# Patient Record
Sex: Male | Born: 1990
Health system: Southern US, Community
[De-identification: ages and names within clinical notes are randomized; demographics above are authoritative.]

## PROBLEM LIST (undated history)

## (undated) DIAGNOSIS — S62309A Unspecified fracture of unspecified metacarpal bone, initial encounter for closed fracture: Secondary | ICD-10-CM

## (undated) DIAGNOSIS — F988 Other specified behavioral and emotional disorders with onset usually occurring in childhood and adolescence: Secondary | ICD-10-CM

## (undated) DIAGNOSIS — G43909 Migraine, unspecified, not intractable, without status migrainosus: Secondary | ICD-10-CM

## (undated) HISTORY — PX: NO PAST SURGERIES: SHX2092

---

## 2014-07-18 DIAGNOSIS — S62309A Unspecified fracture of unspecified metacarpal bone, initial encounter for closed fracture: Secondary | ICD-10-CM

## 2014-07-18 HISTORY — DX: Unspecified fracture of unspecified metacarpal bone, initial encounter for closed fracture: S62.309A

## 2014-07-31 ENCOUNTER — Encounter (HOSPITAL_BASED_OUTPATIENT_CLINIC_OR_DEPARTMENT_OTHER): Payer: Self-pay | Admitting: *Deleted

## 2014-07-31 ENCOUNTER — Emergency Department (HOSPITAL_BASED_OUTPATIENT_CLINIC_OR_DEPARTMENT_OTHER)
Admission: EM | Admit: 2014-07-31 | Discharge: 2014-07-31 | Disposition: A | Discharge status: Home / Self Care | Payer: 59 | Attending: Emergency Medicine | Admitting: Emergency Medicine

## 2014-07-31 ENCOUNTER — Emergency Department (HOSPITAL_BASED_OUTPATIENT_CLINIC_OR_DEPARTMENT_OTHER): Payer: 59

## 2014-07-31 DIAGNOSIS — Z8659 Personal history of other mental and behavioral disorders: Secondary | ICD-10-CM | POA: Diagnosis not present

## 2014-07-31 DIAGNOSIS — Y998 Other external cause status: Secondary | ICD-10-CM | POA: Diagnosis not present

## 2014-07-31 DIAGNOSIS — S62309A Unspecified fracture of unspecified metacarpal bone, initial encounter for closed fracture: Secondary | ICD-10-CM

## 2014-07-31 DIAGNOSIS — S62394A Other fracture of fourth metacarpal bone, right hand, initial encounter for closed fracture: Secondary | ICD-10-CM | POA: Insufficient documentation

## 2014-07-31 DIAGNOSIS — S6991XA Unspecified injury of right wrist, hand and finger(s), initial encounter: Secondary | ICD-10-CM | POA: Diagnosis present

## 2014-07-31 DIAGNOSIS — Y9367 Activity, basketball: Secondary | ICD-10-CM | POA: Diagnosis not present

## 2014-07-31 DIAGNOSIS — W51XXXA Accidental striking against or bumped into by another person, initial encounter: Secondary | ICD-10-CM | POA: Insufficient documentation

## 2014-07-31 DIAGNOSIS — Y92310 Basketball court as the place of occurrence of the external cause: Secondary | ICD-10-CM | POA: Diagnosis not present

## 2014-07-31 MED ORDER — HYDROCODONE-ACETAMINOPHEN 5-325 MG PO TABS: ORAL_TABLET | ORAL | Status: DC

## 2014-07-31 MED ORDER — NAPROXEN 500 MG PO TABS: 500.0000 mg | ORAL_TABLET | Freq: Two times a day (BID) | ORAL | Status: DC
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
## 2014-07-31 NOTE — ED Provider Notes (Signed)
CSN: [id number]     Arrival date & time 07/31/14  1804 History   First MD Initiated Contact with Patient 07/31/14 1824     Chief Complaint  Patient presents with  . Hand Injury     (Consider location/radiation/quality/duration/timing/severity/associated sxs/prior Treatment) HPI Comments: Patient who is right-handed, presents with complaint of right hand pain and swelling that began 4 days ago while playing basketball when the patient was struck on the right hand. Patient had initial pain and soreness in the area of his right fourth digit. He was unable to drive using that hand on the way home from playing basketball. For the past several days, the area has been sore but he has been able to do activities such as lifting weights with the hand. Today he was helping to move a piece of furniture when he felt and heard a crack in his hand at the site of the previous injury. Patient then had severe pain and swelling of his hand. No numbness or tingling. No treatments prior to arrival. No wrist pain, elbow pain, or shoulder pain. Onset was acute. Course is worsening. Nothing makes symptoms better. Movement and palpation makes the pain worse.  The history is provided by the patient.    Past Medical History  Diagnosis Date  . Anxiety    History reviewed. No pertinent past surgical history. No family history on file. History  Substance Use Topics  . Smoking status: Never Smoker   . Smokeless tobacco: Not on file  . Alcohol Use: No    Review of Systems  Constitutional: Negative for activity change.  Musculoskeletal: Positive for joint swelling and arthralgias. Negative for back pain, gait problem and neck pain.  Skin: Negative for wound.  Neurological: Negative for weakness and numbness.    Allergies  Review of patient's allergies indicates no known allergies.  Home Medications   Prior to Admission medications   Not on File   BP 118/64 mmHg  Pulse 72  Temp(Src) 98.3 F (36.8 C)  (Oral)  Resp 20  Ht  (1.905 m)  Wt 225 lb (102.059 kg)  BMI 28.12 kg/m2  SpO2 98%   Physical Exam  Constitutional: He appears well-developed and well-nourished.  HENT:  Head: Normocephalic and atraumatic.  Eyes: Conjunctivae are normal.  Neck: Normal range of motion. Neck supple.  Cardiovascular: Normal pulses.   Pulses:      Dorsalis pedis pulses are 2+ on the right side, and 2+ on the left side.       Posterior tibial pulses are 2+ on the right side, and 2+ on the left side.  Musculoskeletal: He exhibits tenderness. He exhibits no edema.       Right shoulder: Normal.       Right elbow: Normal.      Right wrist: Normal. He exhibits normal range of motion, no tenderness and no bony tenderness.       Right hand: He exhibits decreased range of motion and tenderness. He exhibits normal capillary refill, no deformity and no laceration. Normal sensation noted. He exhibits no finger abduction and no wrist extension trouble.       Hands: Neurological: He is alert. No sensory deficit.  Motor, sensation, and vascular distal to the injury is fully intact.   Skin: Skin is warm and dry.  Psychiatric: He has a normal mood and affect.  Nursing note and vitals reviewed.   ED Course  Procedures (including critical care time) Labs Review Labs Reviewed - No data to  display  Imaging Review Dg Hand Complete Right  07/31/2014   CLINICAL DATA:  Injury while playing basketball 6 days prior  EXAM: RIGHT HAND - COMPLETE 3+ VIEW  COMPARISON:  None.  FINDINGS: Frontal, oblique, and lateral views were obtained. There is a spiral fracture through the midportion of the fourth metacarpal with overall alignment near anatomic. No other fractures. No dislocation. Joint spaces appear intact.  IMPRESSION: Spiral fracture fourth metacarpal.   Electronically Signed   By: Bretta BangWilliam  Woodruff III M.D.   On: 07/31/2014 19:17     EKG Interpretation None       8:34 PM Patient seen and examined. Pt informed of  results. Will splint and give hand follow-up.    Vital signs reviewed and are as follows: BP 118/64 mmHg  Pulse 72  Temp(Src) 98.3 F (36.8 C) (Oral)  Resp 20  Ht 6\' 3"  (1.905 m)  Wt 225 lb (102.059 kg)  BMI 28.12 kg/m2  SpO2 98%  Encouraged follow-up and discuss importance of follow-up to ensure appropriate healing. Pain medication given as needed for home. Patient does not want any pain medicine here.  MDM   Final diagnoses:  Metacarpal bone fracture, closed, initial encounter   Patient with metacarpal bone fracture. Hand is neurovascularly intact. Splinted and ortho f/u indicated.     Renne CriglerJoshua Janett Kamath, PA-C 07/31/14 2049  Vanetta MuldersScott Zackowski, MD 07/31/14 [phone]

## 2014-07-31 NOTE — Discharge Instructions (Signed)
Please read and follow all provided instructions.  Your diagnoses today include:  1. Metacarpal bone fracture, closed, initial encounter     Tests performed today include:  An x-ray of the affected area - shows spiral fracture of the 4th metacarpal bone  Vital signs. See below for your results today.   Medications prescribed:   Vicodin (hydrocodone/acetaminophen) - narcotic pain medication  DO NOT drive or perform any activities that require you to be awake and alert because this medicine can make you drowsy. BE VERY CAREFUL not to take multiple medicines containing Tylenol (also called acetaminophen). Doing so can lead to an overdose which can damage your liver and cause liver failure and possibly death.   Naproxen - anti-inflammatory pain medication  Do not exceed  naproxen every 12 hours, take with food  You have been prescribed an anti-inflammatory medication or NSAID. Take with food. Take smallest effective dose for the shortest duration needed for your pain. Stop taking if you experience stomach pain or vomiting.   Take any prescribed medications only as directed.  Home care instructions:   Follow any educational materials contained in this packet  Follow R.I.C.E. Protocol:  R - rest your injury   I  - use ice on injury without applying directly to skin  C - compress injury with bandage or splint  E - elevate the injury as much as possible  Follow-up instructions: Please follow-up with the provided orthopedic physician (bone specialist).   Return instructions:   Please return if your fingers are numb or tingling, appear gray or blue, or you have severe pain (also elevate the arm and loosen splint or wrap if you were given one)  Please return to the Emergency Department if you experience worsening symptoms.   Please return if you have any other emergent concerns.  Additional Information:  Your vital signs today were: BP 118/64 mmHg   Pulse 72    Temp(Src) 98.3 F (36.8 C) (Oral)   Resp 20   Ht  (1.905 m)   Wt 225 lb (102.059 kg)   BMI 28.12 kg/m2   SpO2 98% If your blood pressure (BP) was elevated above 135/85 this visit, please have this repeated by your doctor within one month. --------------

## 2014-08-05 ENCOUNTER — Other Ambulatory Visit: Payer: Self-pay | Admitting: Orthopedic Surgery

## 2014-08-05 ENCOUNTER — Encounter (HOSPITAL_BASED_OUTPATIENT_CLINIC_OR_DEPARTMENT_OTHER): Payer: Self-pay | Admitting: *Deleted

## 2014-08-12 ENCOUNTER — Ambulatory Visit (HOSPITAL_BASED_OUTPATIENT_CLINIC_OR_DEPARTMENT_OTHER)
Admission: RE | Admit: 2014-08-12 | Discharge: 2014-08-12 | Disposition: A | Discharge status: Home / Self Care | Payer: 59 | Source: Ambulatory Visit | Attending: Orthopedic Surgery | Admitting: Orthopedic Surgery

## 2014-08-12 ENCOUNTER — Ambulatory Visit (HOSPITAL_BASED_OUTPATIENT_CLINIC_OR_DEPARTMENT_OTHER): Payer: 59 | Admitting: Anesthesiology

## 2014-08-12 ENCOUNTER — Encounter (HOSPITAL_BASED_OUTPATIENT_CLINIC_OR_DEPARTMENT_OTHER): Payer: Self-pay | Admitting: *Deleted

## 2014-08-12 ENCOUNTER — Encounter (HOSPITAL_BASED_OUTPATIENT_CLINIC_OR_DEPARTMENT_OTHER)
Admission: RE | Disposition: A | Discharge status: Home / Self Care | Payer: Self-pay | Source: Ambulatory Visit | Attending: Orthopedic Surgery

## 2014-08-12 DIAGNOSIS — Y929 Unspecified place or not applicable: Secondary | ICD-10-CM | POA: Insufficient documentation

## 2014-08-12 DIAGNOSIS — Y999 Unspecified external cause status: Secondary | ICD-10-CM | POA: Insufficient documentation

## 2014-08-12 DIAGNOSIS — X58XXXA Exposure to other specified factors, initial encounter: Secondary | ICD-10-CM | POA: Insufficient documentation

## 2014-08-12 DIAGNOSIS — F988 Other specified behavioral and emotional disorders with onset usually occurring in childhood and adolescence: Secondary | ICD-10-CM | POA: Insufficient documentation

## 2014-08-12 DIAGNOSIS — M25741 Osteophyte, right hand: Secondary | ICD-10-CM | POA: Diagnosis not present

## 2014-08-12 DIAGNOSIS — S62304A Unspecified fracture of fourth metacarpal bone, right hand, initial encounter for closed fracture: Secondary | ICD-10-CM | POA: Insufficient documentation

## 2014-08-12 DIAGNOSIS — Y9367 Activity, basketball: Secondary | ICD-10-CM | POA: Diagnosis not present

## 2014-08-12 HISTORY — DX: Unspecified fracture of unspecified metacarpal bone, initial encounter for closed fracture: S62.309A

## 2014-08-12 HISTORY — PX: OPEN REDUCTION INTERNAL FIXATION (ORIF) METACARPAL: SHX6234

## 2014-08-12 HISTORY — DX: Migraine, unspecified, not intractable, without status migrainosus: G43.909

## 2014-08-12 HISTORY — DX: Other specified behavioral and emotional disorders with onset usually occurring in childhood and adolescence: F98.8

## 2014-08-12 SURGERY — OPEN REDUCTION INTERNAL FIXATION (ORIF) METACARPAL
Anesthesia: General | Site: Finger | Laterality: Right

## 2014-08-12 MED ORDER — LIDOCAINE HCL (CARDIAC) 20 MG/ML IV SOLN
INTRAVENOUS | Status: DC | PRN
  Administered 2014-08-12: 40 mg via INTRAVENOUS

## 2014-08-12 MED ORDER — BUPIVACAINE HCL (PF) 0.25 % IJ SOLN
INTRAMUSCULAR | Status: DC | PRN
  Administered 2014-08-12: 7 mL

## 2014-08-12 MED ORDER — 0.9 % SODIUM CHLORIDE (POUR BTL) OPTIME
TOPICAL | Status: DC | PRN
  Administered 2014-08-12: 200 mL

## 2014-08-12 MED ORDER — DEXAMETHASONE SODIUM PHOSPHATE 4 MG/ML IJ SOLN
INTRAMUSCULAR | Status: DC | PRN
  Administered 2014-08-12: 10 mg via INTRAVENOUS

## 2014-08-12 MED ORDER — SCOPOLAMINE 1 MG/3DAYS TD PT72: 1.0000 | MEDICATED_PATCH | Freq: Once | TRANSDERMAL | Status: DC | PRN

## 2014-08-12 MED ORDER — FENTANYL CITRATE (PF) 100 MCG/2ML IJ SOLN
50.0000 ug | INTRAMUSCULAR | Status: AC | PRN
  Administered 2014-08-12: 25 ug via INTRAVENOUS
  Administered 2014-08-12: 100 ug via INTRAVENOUS
  Administered 2014-08-12 (×2): 25 ug via INTRAVENOUS

## 2014-08-12 MED ORDER — CHLORHEXIDINE GLUCONATE 4 % EX LIQD: 60.0000 mL | Freq: Once | CUTANEOUS | Status: DC

## 2014-08-12 MED ORDER — OXYCODONE HCL 5 MG/5ML PO SOLN: 5.0000 mg | Freq: Once | ORAL | Status: AC | PRN

## 2014-08-12 MED ORDER — FENTANYL CITRATE (PF) 100 MCG/2ML IJ SOLN
INTRAMUSCULAR | Status: AC
  Filled 2014-08-12: qty 6

## 2014-08-12 MED ORDER — HYDROMORPHONE HCL 1 MG/ML IJ SOLN: 0.2500 mg | INTRAMUSCULAR | Status: DC | PRN

## 2014-08-12 MED ORDER — KETOROLAC TROMETHAMINE 30 MG/ML IJ SOLN: 30.0000 mg | Freq: Once | INTRAMUSCULAR | Status: DC | PRN

## 2014-08-12 MED ORDER — OXYCODONE HCL 5 MG PO TABS
5.0000 mg | ORAL_TABLET | Freq: Once | ORAL | Status: AC | PRN
  Administered 2014-08-12: 5 mg via ORAL

## 2014-08-12 MED ORDER — MIDAZOLAM HCL 2 MG/2ML IJ SOLN
INTRAMUSCULAR | Status: AC
  Filled 2014-08-12: qty 2

## 2014-08-12 MED ORDER — CEFAZOLIN SODIUM-DEXTROSE 2-3 GM-% IV SOLR
INTRAVENOUS | Status: AC
  Filled 2014-08-12: qty 50

## 2014-08-12 MED ORDER — MIDAZOLAM HCL 2 MG/2ML IJ SOLN
1.0000 mg | INTRAMUSCULAR | Status: DC | PRN
  Administered 2014-08-12: 2 mg via INTRAVENOUS

## 2014-08-12 MED ORDER — ONDANSETRON HCL 4 MG/2ML IJ SOLN
INTRAMUSCULAR | Status: DC | PRN
  Administered 2014-08-12: 4 mg via INTRAVENOUS

## 2014-08-12 MED ORDER — OXYCODONE HCL 5 MG PO TABS
ORAL_TABLET | ORAL | Status: AC
Start: 2014-08-12 — End: 2014-08-12
  Filled 2014-08-12: qty 1

## 2014-08-12 MED ORDER — GLYCOPYRROLATE 0.2 MG/ML IJ SOLN: 0.2000 mg | Freq: Once | INTRAMUSCULAR | Status: DC | PRN

## 2014-08-12 MED ORDER — LACTATED RINGERS IV SOLN
INTRAVENOUS | Status: DC
  Administered 2014-08-12: 14:00:00 via INTRAVENOUS

## 2014-08-12 MED ORDER — HYDROCODONE-ACETAMINOPHEN 5-325 MG PO TABS: ORAL_TABLET | ORAL | Status: DC

## 2014-08-12 MED ORDER — PROMETHAZINE HCL 25 MG/ML IJ SOLN
6.2500 mg | INTRAMUSCULAR | Status: DC | PRN
Start: 2014-08-12 — End: 2014-08-12

## 2014-08-12 MED ORDER — PROPOFOL 10 MG/ML IV BOLUS
INTRAVENOUS | Status: DC | PRN
  Administered 2014-08-12: 200 mg via INTRAVENOUS

## 2014-08-12 MED ORDER — PROPOFOL 10 MG/ML IV BOLUS
INTRAVENOUS | Status: AC
  Filled 2014-08-12: qty 20

## 2014-08-12 MED ORDER — CEFAZOLIN SODIUM-DEXTROSE 2-3 GM-% IV SOLR
2.0000 g | INTRAVENOUS | Status: AC
  Administered 2014-08-12: 2 g via INTRAVENOUS

## 2014-08-12 SURGICAL SUPPLY — 66 items
BANDAGE ELASTIC 3 VELCRO ST LF (GAUZE/BANDAGES/DRESSINGS) ×3 IMPLANT
BIT DRILL 1.1 (BIT) ×1
BIT DRILL 1.1MM (BIT) ×1
BIT DRILL 60X20X1.1XQC TMX (BIT) ×1 IMPLANT
BIT DRL 60X20X1.1XQC TMX (BIT) ×1
BLADE MINI RND TIP GREEN BEAV (BLADE) IMPLANT
BLADE SURG 15 STRL LF DISP TIS (BLADE) ×2 IMPLANT
BLADE SURG 15 STRL SS (BLADE) ×4
BNDG ESMARK 4X9 LF (GAUZE/BANDAGES/DRESSINGS) ×3 IMPLANT
BNDG GAUZE ELAST 4 BULKY (GAUZE/BANDAGES/DRESSINGS) ×6 IMPLANT
CHLORAPREP W/TINT 26ML (MISCELLANEOUS) ×3 IMPLANT
CORDS BIPOLAR (ELECTRODE) ×3 IMPLANT
COUNTERSINK 1.3MM/1.5MM (INSTRUMENTS) ×3
COVER BACK TABLE 60X90IN (DRAPES) ×3 IMPLANT
COVER MAYO STAND STRL (DRAPES) ×3 IMPLANT
CUFF TOURNIQUET SINGLE 18IN (TOURNIQUET CUFF) ×3 IMPLANT
DRAPE EXTREMITY T 121X128X90 (DRAPE) ×3 IMPLANT
DRAPE OEC MINIVIEW 54X84 (DRAPES) ×3 IMPLANT
DRAPE SURG 17X23 STRL (DRAPES) ×3 IMPLANT
DRIVER BIT 1.5 (TRAUMA) ×3 IMPLANT
GAUZE SPONGE 4X4 12PLY STRL (GAUZE/BANDAGES/DRESSINGS) ×3 IMPLANT
GAUZE XEROFORM 1X8 LF (GAUZE/BANDAGES/DRESSINGS) ×3 IMPLANT
GLOVE BIO SURGEON STRL SZ7.5 (GLOVE) ×3 IMPLANT
GLOVE BIOGEL PI IND STRL 7.0 (GLOVE) ×1 IMPLANT
GLOVE BIOGEL PI IND STRL 8 (GLOVE) ×1 IMPLANT
GLOVE BIOGEL PI IND STRL 8.5 (GLOVE) ×1 IMPLANT
GLOVE BIOGEL PI INDICATOR 7.0 (GLOVE) ×2
GLOVE BIOGEL PI INDICATOR 8 (GLOVE) ×2
GLOVE BIOGEL PI INDICATOR 8.5 (GLOVE) ×2
GLOVE ECLIPSE 6.5 STRL STRAW (GLOVE) ×3 IMPLANT
GLOVE EXAM NITRILE MD LF STRL (GLOVE) ×3 IMPLANT
GLOVE SURG ORTHO 8.0 STRL STRW (GLOVE) ×3 IMPLANT
GOWN STRL REUS W/ TWL LRG LVL3 (GOWN DISPOSABLE) ×1 IMPLANT
GOWN STRL REUS W/ TWL XL LVL3 (GOWN DISPOSABLE) IMPLANT
GOWN STRL REUS W/TWL LRG LVL3 (GOWN DISPOSABLE) ×2
GOWN STRL REUS W/TWL XL LVL3 (GOWN DISPOSABLE) ×6 IMPLANT
NEEDLE HYPO 22GX1.5 SAFETY (NEEDLE) IMPLANT
NEEDLE HYPO 25X1 1.5 SAFETY (NEEDLE) IMPLANT
NS IRRIG 1000ML POUR BTL (IV SOLUTION) ×3 IMPLANT
PACK BASIN DAY SURGERY FS (CUSTOM PROCEDURE TRAY) ×3 IMPLANT
PAD CAST 3X4 CTTN HI CHSV (CAST SUPPLIES) ×1 IMPLANT
PAD CAST 4YDX4 CTTN HI CHSV (CAST SUPPLIES) ×1 IMPLANT
PADDING CAST ABS 4INX4YD NS (CAST SUPPLIES)
PADDING CAST ABS COTTON 4X4 ST (CAST SUPPLIES) IMPLANT
PADDING CAST COTTON 3X4 STRL (CAST SUPPLIES) ×2
PADDING CAST COTTON 4X4 STRL (CAST SUPPLIES) ×2
SCREW COUNTERSINK 1.3MM/1.5MM (INSTRUMENTS) ×1 IMPLANT
SCREW NL 1.5X11 WRIST (Screw) ×9 IMPLANT
SCREW NONIOC 1.5 10M (Screw) ×3 IMPLANT
SLEEVE SCD COMPRESS KNEE MED (MISCELLANEOUS) ×3 IMPLANT
SPLINT PLASTER CAST XFAST 3X15 (CAST SUPPLIES) ×20 IMPLANT
SPLINT PLASTER CAST XFAST 4X15 (CAST SUPPLIES) IMPLANT
SPLINT PLASTER XTRA FAST SET 4 (CAST SUPPLIES)
SPLINT PLASTER XTRA FASTSET 3X (CAST SUPPLIES) ×40
STOCKINETTE 4X48 STRL (DRAPES) ×3 IMPLANT
SUT CHROMIC 5 0 P 3 (SUTURE) ×3 IMPLANT
SUT ETHILON 3 0 PS 1 (SUTURE) IMPLANT
SUT ETHILON 4 0 PS 2 18 (SUTURE) ×3 IMPLANT
SUT MERSILENE 4 0 P 3 (SUTURE) IMPLANT
SUT VIC AB 3-0 PS1 18 (SUTURE)
SUT VIC AB 3-0 PS1 18XBRD (SUTURE) IMPLANT
SUT VICRYL 4-0 PS2 18IN ABS (SUTURE) ×3 IMPLANT
SYR BULB 3OZ (MISCELLANEOUS) ×3 IMPLANT
SYR CONTROL 10ML LL (SYRINGE) IMPLANT
TOWEL OR 17X24 6PK STRL BLUE (TOWEL DISPOSABLE) ×6 IMPLANT
UNDERPAD 30X30 (UNDERPADS AND DIAPERS) ×3 IMPLANT

## 2014-08-12 NOTE — Transfer of Care (Signed)
Immediate Anesthesia Transfer of Care Note  Patient: Tyler Garrett  Procedure(s) Performed: Procedure(s): OPEN REDUCTION INTERNAL FIXATION (ORIF) RIGHT RING FINGER METACARPAL FRACTURE (Right)  Patient Location: PACU  Anesthesia Type:General  Level of Consciousness: awake, sedated and patient cooperative  Airway & Oxygen Therapy: Patient Spontanous Breathing and Patient connected to face mask oxygen  Post-op Assessment: Report given to RN and Post -op Vital signs reviewed and stable  Post vital signs: Reviewed and stable  Last Vitals:  Filed Vitals:   08/12/14 1323  BP: 101/68  Pulse: 61  Temp: 36.8 C  Resp: 20    Complications: No apparent anesthesia complications

## 2014-08-12 NOTE — H&P (Signed)
  Tyler Garrett is an 24 y.o. male.   Chief Complaint: right ring metacarpal fracture HPI: 24 yo rhd male states he injured right hand playing basketball 07/27/14.  Seen at [facility] where XR revealed metacarpal fracture.  Splinted and followed up in office.  Reports no previous injury to the hand and no other injury at this time.  Past Medical History  Diagnosis Date  . Migraines   . ADD (attention deficit disorder)   . Metacarpal bone fracture 07/2014    right ring    Past Surgical History  Procedure Laterality Date  . No past surgeries      History reviewed. No pertinent family history. Social History:  reports that he has never smoked. He has never used smokeless tobacco. He reports that he does not drink alcohol or use illicit drugs.  Allergies: No Known Allergies  No prescriptions prior to admission    No results found for this or any previous visit (from the past 48 hour(s)).  No results found.   A comprehensive review of systems was negative except for: Eyes: positive for contacts/glasses  Height  (1.905 m), weight 101.152 kg (223 lb).  General appearance: alert, cooperative and appears stated age Head: Normocephalic, without obvious abnormality, atraumatic Neck: supple, symmetrical, trachea midline Resp: clear to auscultation bilaterally Cardio: regular rate and rhythm GI: non tender Extremities: intact sensation and capillary refill all digits.  +epl/fpl/io.  no wounds. Pulses: 2+ and symmetric Skin: Skin color, texture, turgor normal. No rashes or lesions Neurologic: Grossly normal Incision/Wound:  none  Assessment/Plan Right ring finger metacarpal fracture.  Non operative and operative treatment options were discussed with the patient and patient wishes to proceed with operative treatment. Risks, benefits, and alternatives of surgery were discussed and the patient agrees with the plan of care.   Aldea Avis R 08/12/2014, 11:52 AM

## 2014-08-12 NOTE — Brief Op Note (Signed)
08/12/2014  3:59 PM  PATIENT:  Tyler Garrett  24 y.o. male  PRE-OPERATIVE DIAGNOSIS:  RIGHT RING FINGER METACARPAL FRACTURE  POST-OPERATIVE DIAGNOSIS:  RIGHT RING FINGER METACARPAL FRACTURE  PROCEDURE:  Procedure(s): OPEN REDUCTION INTERNAL FIXATION (ORIF) RIGHT RING FINGER METACARPAL FRACTURE (Right)  SURGEON:  Surgeon(s) and Role:    * Betha Loa, MD - Primary    * Cindee Salt, MD - Assisting  PHYSICIAN ASSISTANT:   ASSISTANTS: Cindee Salt, MD   ANESTHESIA:   general  EBL:     BLOOD ADMINISTERED:none  DRAINS: none   LOCAL MEDICATIONS USED:  MARCAINE     SPECIMEN:  No Specimen  DISPOSITION OF SPECIMEN:  N/A  COUNTS:  YES  TOURNIQUET:   Total Tourniquet Time Documented: Upper Arm (Right) - 38 minutes Total: Upper Arm (Right) - 38 minutes   DICTATION: .Other Dictation: Dictation Number [id number]  PLAN OF CARE: Discharge to home after PACU  PATIENT DISPOSITION:  PACU - hemodynamically stable.

## 2014-08-12 NOTE — Op Note (Signed)
[id number] 

## 2014-08-12 NOTE — Anesthesia Procedure Notes (Signed)
Procedure Name: LMA Insertion Date/Time: 08/12/2014 3:07 PM Performed by: Gar Gibbon Pre-anesthesia Checklist: Patient identified, Emergency Drugs available, Suction available and Patient being monitored Patient Re-evaluated:Patient Re-evaluated prior to inductionOxygen Delivery Method: Circle System Utilized Preoxygenation: Pre-oxygenation with 100% oxygen Intubation Type: IV induction Ventilation: Mask ventilation without difficulty LMA: LMA inserted LMA Size: 4.0 Number of attempts: 1 Airway Equipment and Method: Bite block Placement Confirmation: positive ETCO2 Tube secured with: Tape Dental Injury: Teeth and Oropharynx as per pre-operative assessment

## 2014-08-12 NOTE — Anesthesia Postprocedure Evaluation (Signed)
  Anesthesia Post-op Note  Patient: Tyler Garrett  Procedure(s) Performed: Procedure(s): OPEN REDUCTION INTERNAL FIXATION (ORIF) RIGHT RING FINGER METACARPAL FRACTURE (Right)  Patient Location: PACU  Anesthesia Type:General  Level of Consciousness: awake, alert  and oriented  Airway and Oxygen Therapy: Patient Spontanous Breathing  Post-op Pain: mild  Post-op Assessment: Post-op Vital signs reviewed              Post-op Vital Signs: Reviewed  Last Vitals:  Filed Vitals:   08/12/14 1700  BP: 120/68  Pulse: 74  Temp: 36.6 C  Resp: 14    Complications: No apparent anesthesia complications

## 2014-08-12 NOTE — Op Note (Signed)
Intra-operative fluoroscopic images in the AP, lateral, and oblique views were taken and evaluated by myself.  Reduction and hardware placement were confirmed.  There was no intraarticular penetration of permanent hardware.  

## 2014-08-12 NOTE — Anesthesia Preprocedure Evaluation (Signed)
Anesthesia Evaluation  Patient identified by MRN, date of birth, ID band Patient awake    Reviewed: Allergy & Precautions, NPO status , Patient's Chart, lab work & pertinent test results  Airway Mallampati: II  TM Distance: >3 FB Neck ROM: Full    Dental   Pulmonary neg pulmonary ROS,  breath sounds clear to auscultation        Cardiovascular negative cardio ROS  Rhythm:Regular Rate:Normal     Neuro/Psych negative neurological ROS     GI/Hepatic negative GI ROS, Neg liver ROS,   Endo/Other  negative endocrine ROS  Renal/GU negative Renal ROS     Musculoskeletal negative musculoskeletal ROS (+)   Abdominal   Peds  Hematology negative hematology ROS (+)   Anesthesia Other Findings   Reproductive/Obstetrics                             Anesthesia Physical Anesthesia Plan  ASA: I  Anesthesia Plan: General   Post-op Pain Management:    Induction: Intravenous  Airway Management Planned: LMA  Additional Equipment:   Intra-op Plan:   Post-operative Plan:   Informed Consent: I have reviewed the patients History and Physical, chart, labs and discussed the procedure including the risks, benefits and alternatives for the proposed anesthesia with the patient or authorized representative who has indicated his/her understanding and acceptance.   Dental advisory given  Plan Discussed with: CRNA  Anesthesia Plan Comments: (After discussion with patient and pt's father, pt consented to general anesthesia with post-op peripheral nerve block should pain be uncontrolled with IV or PO pain meds. Standard monitors.)        Anesthesia Quick Evaluation

## 2014-08-12 NOTE — Discharge Instructions (Addendum)

## 2014-08-13 ENCOUNTER — Encounter (HOSPITAL_BASED_OUTPATIENT_CLINIC_OR_DEPARTMENT_OTHER): Payer: Self-pay | Admitting: Orthopedic Surgery

## 2014-08-13 NOTE — Op Note (Signed)
Tyler Garrett, Tyler Garrett              ACCOUNT NO.:  [id number]  MEDICAL RECORD NO.:  [id number]  LOCATION:                               FACILITY:  [facility]  PHYSICIAN:  Betha Loa, MD        DATE OF BIRTH:  [birth year]  DATE OF PROCEDURE:  08/12/2014 DATE OF DISCHARGE:  08/12/2014                              OPERATIVE REPORT   PREOPERATIVE DIAGNOSIS:  Right ring finger metacarpal fracture.  POSTOPERATIVE DIAGNOSIS:  Right ring finger metacarpal fracture.  PROCEDURE:  Open reduction and internal fixation, right ring finger metacarpal fracture.  SURGEON:  Betha Loa, M.D.  ASSISTANT:  Cindee Salt, M.D.  ANESTHESIA:  General.  IV FLUIDS:  Per anesthesia flow sheet.  ESTIMATED BLOOD LOSS:  Minimal.  COMPLICATIONS:  None.  SPECIMENS:  None.  TOURNIQUET TIME:  38 minutes.  DISPOSITION:  Stable to PACU.  INDICATIONS:  Tyler Garrett is a 24 year old male, who is present with his father.  He states he injured his right hand playing basketball.  He presented to the office.  Radiographs revealed a ring finger metacarpal fracture with shortening.  We discussed nonoperative and operative treatment options.  He wished to proceed with operative care.  Risks, benefits, and alternatives of the surgery were discussed including the risk of blood loss; infection; damage to nerves, vessels, tendons, ligaments, bone; failure of surgery; need for additional surgery; complications with wound healing, continued pain, nonunion, malunion, and stiffness.  He voiced understanding of these risks and elected to proceed.  OPERATIVE COURSE:  After being identified preoperatively by myself, the patient and I agreed upon procedure and site procedure.  The surgical site was marked.  The risks, benefits, and alternatives of surgery were reviewed he wished to proceed.  Surgical consent had been signed.  He was given IV Ancef as preoperative antibiotic prophylaxis.  He was transferred to the operating room and  placed on the operating room table in supine position with the right upper extremity on an armboard. General anesthesia was induced by anesthesiologist.  The right upper extremity was prepped and draped in normal sterile orthopedic fashion. A surgical pause was performed between surgeons, anesthesia and operating staff, and all were in agreement as to the patient, procedure, and site of procedure.  Tourniquet at the proximal aspect of the extremity was inflated to 250 mmHg after exsanguination of the limb with an Esmarch bandage.  An incision was made over the ring finger metacarpal in the dorsum of the hand.  This was carried into the subcutaneous tissues by spreading technique.  Bipolar electrocautery was used to obtain hemostasis.  The extensor tendons were retracted.  The periosteum was sharply incised with the knife and elevated with a Therapist, nutritional.  The fracture site was identified.  There was callus formation.  The callus was cleared out.  The fracture was reduced under direct visualization.  Near anatomic reduction was obtained.  C-arm standard AO drilling and measuring technique was used.  A 1.5-mm screws from the ALPS set were selected.  Four screws were placed across the fracture.  This provided good stabilization.  The C-arm was used in AP, lateral, and oblique projections to ensure appropriate reduction and  position of hardware, which was the case.  The wrist was placed through tenodesis.  There was no scissoring.  The wound was copiously irrigated with sterile saline.  The periosteum was repaired with 5-0 chromic suture in a running fashion.  The skin was closed with 4-0 nylon in a horizontal mattress fashion.  The wound was injected with 5 mL of 0.25% plain Marcaine to aid postoperative analgesia.  The wound was then dressed with sterile Xeroform, 4x4s, and wrapped with a Kerlix bandage. A volar and dorsal slab splint including the long, ring, and small fingers with the  MPs flexed and the IPs extended was placed.  This was wrapped with Kerlix and Ace bandage.  Tourniquet was deflated at 38 minutes.  Fingertips were pink with brisk capillary refill after deflation of tourniquet.  Operative drapes were broken down.  The patient was awoken from anesthesia safely.  He was transferred back to stretcher and taken to PACU in stable condition.  I will see him back in the office in 1 week for postoperative followup.  I will give him Norco 5/325, 1-2 p.o. q.6 hours p.r.n. pain, dispensed #40.     Betha Loa, MD     KK/MEDQ  D:  08/12/2014  T:  08/13/2014  Job:  [id number]

## 2015-12-23 ENCOUNTER — Emergency Department (HOSPITAL_COMMUNITY)
Admission: EM | Admit: 2015-12-23 | Discharge: 2015-12-23 | Disposition: A | Discharge status: Home / Self Care | Payer: 59 | Attending: Emergency Medicine | Admitting: Emergency Medicine

## 2015-12-23 ENCOUNTER — Encounter (HOSPITAL_COMMUNITY): Payer: Self-pay

## 2015-12-23 DIAGNOSIS — Y929 Unspecified place or not applicable: Secondary | ICD-10-CM | POA: Insufficient documentation

## 2015-12-23 DIAGNOSIS — S0501XA Injury of conjunctiva and corneal abrasion without foreign body, right eye, initial encounter: Secondary | ICD-10-CM | POA: Diagnosis not present

## 2015-12-23 DIAGNOSIS — Y9389 Activity, other specified: Secondary | ICD-10-CM | POA: Diagnosis not present

## 2015-12-23 DIAGNOSIS — S0502XA Injury of conjunctiva and corneal abrasion without foreign body, left eye, initial encounter: Secondary | ICD-10-CM | POA: Diagnosis not present

## 2015-12-23 DIAGNOSIS — W378XXA Explosion and rupture of other pressurized tire, pipe or hose, initial encounter: Secondary | ICD-10-CM | POA: Insufficient documentation

## 2015-12-23 DIAGNOSIS — F909 Attention-deficit hyperactivity disorder, unspecified type: Secondary | ICD-10-CM | POA: Diagnosis not present

## 2015-12-23 DIAGNOSIS — Z23 Encounter for immunization: Secondary | ICD-10-CM | POA: Insufficient documentation

## 2015-12-23 DIAGNOSIS — Y999 Unspecified external cause status: Secondary | ICD-10-CM | POA: Insufficient documentation

## 2015-12-23 DIAGNOSIS — S0081XA Abrasion of other part of head, initial encounter: Secondary | ICD-10-CM

## 2015-12-23 DIAGNOSIS — S0592XA Unspecified injury of left eye and orbit, initial encounter: Secondary | ICD-10-CM | POA: Diagnosis present

## 2015-12-23 MED ORDER — FLUORESCEIN SODIUM 1 MG OP STRP
1.0000 | ORAL_STRIP | Freq: Once | OPHTHALMIC | Status: AC
  Administered 2015-12-23: 1 via OPHTHALMIC
  Filled 2015-12-23: qty 1

## 2015-12-23 MED ORDER — ERYTHROMYCIN 5 MG/GM OP OINT
1.0000 "application " | TOPICAL_OINTMENT | Freq: Once | OPHTHALMIC | Status: AC
  Administered 2015-12-23: 1 via OPHTHALMIC
  Filled 2015-12-23: qty 3.5

## 2015-12-23 MED ORDER — PROPARACAINE HCL 0.5 % OP SOLN
1.0000 [drp] | Freq: Once | OPHTHALMIC | Status: AC
  Administered 2015-12-23: 1 [drp] via OPHTHALMIC
  Filled 2015-12-23: qty 15

## 2015-12-23 MED ORDER — TETANUS-DIPHTH-ACELL PERTUSSIS 5-2.5-18.5 LF-MCG/0.5 IM SUSP
0.5000 mL | Freq: Once | INTRAMUSCULAR | Status: AC
  Administered 2015-12-23: 0.5 mL via INTRAMUSCULAR
  Filled 2015-12-23: qty 0.5

## 2015-12-23 MED ORDER — HYDROCODONE-ACETAMINOPHEN 5-325 MG PO TABS
ORAL_TABLET | ORAL | 0 refills | Status: DC
Start: 2015-12-23 — End: 2020-05-29
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
## 2015-12-23 NOTE — Discharge Instructions (Signed)
Please read and follow all provided instructions.  Your diagnoses today include:  1. Abrasion of left cornea, initial encounter   2. Abrasion of right cornea, initial encounter   3. Abrasion of face, initial encounter     Tests performed today include:  Visual acuity testing to check your vision  Fluorescein dye examination to look for scratches on your eye  Vital signs. See below for your results today.   Medications prescribed:   Vicodin (hydrocodone/acetaminophen) - narcotic pain medication  DO NOT drive or perform any activities that require you to be awake and alert because this medicine can make you drowsy. BE VERY CAREFUL not to take multiple medicines containing Tylenol (also called acetaminophen). Doing so can lead to an overdose which can damage your liver and cause liver failure and possibly death.   Erythromycin  - antibiotic eye ointment  Use this medication as follows:  Apply 1/4" of the antibiotic ointment to affected eye up to 6 times a day while awake for 7 days  Take any prescribed medications only as directed.  Home care instructions:  Follow any educational materials contained in this packet.  You have a scratch of the eye on the cornea (the clear part of the eye). This condition may be caused by trauma. It is a common problem for people who wear contact lenses. Proper treatment is important. No evidence of infection is noted today, but you could develop an infection called a corneal ulcer or have some retained foreign body that may or may not have been noted today in the Emergency Department. Ulcers are not only painful, but they may also scar the cornea and cause permanent damage to the eye.   If you wear contact lenses, do not use them until your eye caregiver approves. Follow-up care is necessary to be sure the corneal abrasion is healing if not completely resolved in 2-3 days. See your caregiver or eye specialist as suggested for followup.   Follow-up  instructions: Please follow-up with Dr. Allena KatzPatel in his office tomorrow at 1:30PM.    Return instructions:   Please return to the Emergency Department if you experience worsening symptoms.   Please return immediately if you develop severe pain, pus drainage, new change in vision, or fever.  Please return if you have any other emergent concerns.  Additional Information:  Your vital signs today were: BP 101/68 (BP Location: Right Arm)    Pulse 68    Temp 97.8 F (36.6 C) (Oral)    Resp 20    SpO2 99%  If your blood pressure (BP) was elevated above 135/85 this visit, please have this repeated by your doctor within one month.

## 2015-12-23 NOTE — ED Notes (Signed)
Papers reviewed along with prescriptions and ointment application demonstrated

## 2015-12-23 NOTE — ED Notes (Signed)
Checked patient eyes patient stated that he wear glasses he was 20/50 both eyes patient was 20/200 left eye and right eye he was 20/200
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
## 2017-10-23 ENCOUNTER — Other Ambulatory Visit: Payer: Self-pay

## 2017-10-23 ENCOUNTER — Encounter (HOSPITAL_BASED_OUTPATIENT_CLINIC_OR_DEPARTMENT_OTHER): Payer: Self-pay | Admitting: *Deleted

## 2017-10-23 ENCOUNTER — Emergency Department (HOSPITAL_BASED_OUTPATIENT_CLINIC_OR_DEPARTMENT_OTHER)
Admission: EM | Admit: 2017-10-23 | Discharge: 2017-10-23 | Disposition: A | Discharge status: Home / Self Care | Payer: 59 | Attending: Emergency Medicine | Admitting: Emergency Medicine

## 2017-10-23 DIAGNOSIS — R05 Cough: Secondary | ICD-10-CM | POA: Diagnosis present

## 2017-10-23 DIAGNOSIS — R42 Dizziness and giddiness: Secondary | ICD-10-CM | POA: Diagnosis not present

## 2017-10-23 DIAGNOSIS — J069 Acute upper respiratory infection, unspecified: Secondary | ICD-10-CM | POA: Diagnosis not present

## 2017-10-23 DIAGNOSIS — B9789 Other viral agents as the cause of diseases classified elsewhere: Secondary | ICD-10-CM | POA: Diagnosis not present

## 2017-10-23 DIAGNOSIS — R070 Pain in throat: Secondary | ICD-10-CM | POA: Insufficient documentation

## 2017-10-23 DIAGNOSIS — R197 Diarrhea, unspecified: Secondary | ICD-10-CM | POA: Insufficient documentation

## 2017-10-23 LAB — GROUP A STREP BY PCR: GROUP A STREP BY PCR: NOT DETECTED

## 2017-10-23 NOTE — Discharge Instructions (Signed)
You likely have a viral infection   Take tylenol, motrin for chills or fever.  Stay hydrated.   Rest today   See your doctor  Return to ER if you have worse cough, sore throat, fever > 101, vomiting, dehydration

## 2017-10-23 NOTE — ED Provider Notes (Signed)
[facility] EMERGENCY DEPARTMENT Provider Note   CSN: [id number] Arrival date & time: 10/23/17  1109     History   Chief Complaint Chief Complaint  Patient presents with  . Sore Throat  . Cough    HPI Tyler Garrett is a 27 y.o. male hx of ADD, here with cough, sore throat, diarrhea. Patient has nonproductive cough for the last 2 weeks. Also some sore throat as well. Has chills but no actual fevers. Had loose stools today but no vomiting. Felt dizzy today so came for evaluation. His significant other also had similar symptoms. Otherwise healthy.   The history is provided by the patient.    Past Medical History:  Diagnosis Date  . ADD (attention deficit disorder)   . Metacarpal bone fracture 07/2014   right ring  . Migraines     There are no active problems to display for this patient.   Past Surgical History:  Procedure Laterality Date  . NO PAST SURGERIES    . OPEN REDUCTION INTERNAL FIXATION (ORIF) METACARPAL Right 08/12/2014   Procedure: OPEN REDUCTION INTERNAL FIXATION (ORIF) RIGHT RING FINGER METACARPAL FRACTURE;  Surgeon: Betha Loa, MD;  Location: [facility];  Service: Orthopedics;  Laterality: Right;        Home Medications    Prior to Admission medications   Medication Sig Start Date End Date Taking? Authorizing Provider  HYDROcodone-acetaminophen (NORCO/VICODIN) 5-325 MG tablet Take 1-2 tablets every 6 hours as needed for severe pain 12/23/15   Renne Crigler, PA-C    Family History No family history on file.  Social History Social History   Tobacco Use  . Smoking status: Never Smoker  . Smokeless tobacco: Never Used  Substance Use Topics  . Alcohol use: No  . Drug use: No     Allergies   Patient has no known allergies.   Review of Systems Review of Systems  Respiratory: Positive for cough.   All other systems reviewed and are negative.    Physical Exam Updated Vital Signs BP 103/67   Pulse 67   Temp  98.6 F (37 C) (Oral)   Resp 16   Ht 6\' 3"  (1.905 m)   Wt 108.9 kg   SpO2 98%   BMI 30.00 kg/m   Physical Exam  Constitutional: He is oriented to person, place, and time. He appears well-developed.  HENT:  Head: Normocephalic.  Right Ear: Tympanic membrane normal.  Left Ear: Tympanic membrane normal.  Mouth/Throat: Uvula is midline, oropharynx is clear and moist and mucous membranes are normal.  Eyes: Pupils are equal, round, and reactive to light. EOM are normal.  Neck: Normal range of motion. Neck supple.  Cardiovascular: Normal rate, regular rhythm and normal heart sounds.  Pulmonary/Chest: Effort normal and breath sounds normal.  Abdominal: Soft. Bowel sounds are normal.  Neurological: He is alert and oriented to person, place, and time.  Skin: Skin is warm. Capillary refill takes less than 2 seconds.  Psychiatric: He has a normal mood and affect. His behavior is normal.  Nursing note and vitals reviewed.    ED Treatments / Results  Labs (all labs ordered are listed, but only abnormal results are displayed) Labs Reviewed  GROUP A STREP BY PCR    EKG None  Radiology No results found.  Procedures Procedures (including critical care time)  Medications Ordered in ED Medications - No data to display   Initial Impression / Assessment and Plan / ED Course  I have reviewed the triage  vital signs and the nursing notes.  Pertinent labs & imaging results that were available during my care of the patient were reviewed by me and considered in my medical decision making (see chart for details).    Tyler Garrett is a 27 y.o. male here with sore throat, cough, dizziness, chills. Likely viral syndrome. Well appearing. Afebrile, OP clear. No cervical LAD. Lungs clear, abdomen nontender. Strep screen negative. Recommend motrin, tylenol, hydration.    Final Clinical Impressions(s) / ED Diagnoses   Final diagnoses:  None    ED Discharge Orders    None       Charlynne Pander, MD 10/23/17 1221

## 2017-10-23 NOTE — ED Notes (Signed)
Pt discharged to home with family. NAD.  

## 2017-10-23 NOTE — ED Triage Notes (Signed)
Cough, sore throat x 2 weeks. He woke with diarrhea, dizziness and fatigue today.

## 2018-02-26 ENCOUNTER — Other Ambulatory Visit: Payer: Self-pay

## 2018-02-26 ENCOUNTER — Encounter (HOSPITAL_BASED_OUTPATIENT_CLINIC_OR_DEPARTMENT_OTHER): Payer: Self-pay | Admitting: *Deleted

## 2018-02-26 ENCOUNTER — Emergency Department (HOSPITAL_BASED_OUTPATIENT_CLINIC_OR_DEPARTMENT_OTHER)
Admission: EM | Admit: 2018-02-26 | Discharge: 2018-02-26 | Disposition: A | Discharge status: Home / Self Care | Payer: 59 | Attending: Emergency Medicine | Admitting: Emergency Medicine

## 2018-02-26 DIAGNOSIS — R070 Pain in throat: Secondary | ICD-10-CM | POA: Diagnosis not present

## 2018-02-26 DIAGNOSIS — R05 Cough: Secondary | ICD-10-CM | POA: Diagnosis not present

## 2018-02-26 DIAGNOSIS — R059 Cough, unspecified: Secondary | ICD-10-CM

## 2018-02-26 DIAGNOSIS — J029 Acute pharyngitis, unspecified: Secondary | ICD-10-CM

## 2018-02-26 LAB — GROUP A STREP BY PCR: GROUP A STREP BY PCR: NOT DETECTED

## 2018-02-26 MED ORDER — PREDNISONE 10 MG (21) PO TBPK: ORAL_TABLET | ORAL | 0 refills | Status: DC

## 2018-02-26 MED ORDER — FLUTICASONE PROPIONATE 50 MCG/ACT NA SUSP: 2.0000 | Freq: Every day | NASAL | 0 refills | Status: DC

## 2018-02-26 MED ORDER — BENZONATATE 100 MG PO CAPS: 100.0000 mg | ORAL_CAPSULE | Freq: Three times a day (TID) | ORAL | 0 refills | Status: DC

## 2018-02-26 MED FILL — predniSONE 10 MG TABS: 10 | 6 days supply | Qty: 21 | Fill #0

## 2018-02-26 MED FILL — BENZONATATE 100 MG CAP: 100 | 7 days supply | Qty: 21 | Fill #0

## 2018-02-26 MED FILL — FLUTICASONE PROP 50 MCG SPR: 50 | 30 days supply | Qty: 16 | Fill #0

## 2018-02-26 NOTE — ED Triage Notes (Signed)
Cough, sore throat, body aches and fever for a week. No fever reducer today.

## 2018-02-26 NOTE — Discharge Instructions (Addendum)
The strep test was negative.  Your symptoms are likely consistent with a viral illness. Viruses do not require or respond to antibiotics. Treatment is symptomatic care and it is important to note that these symptoms may last for 7-14 days.   Hand washing: Wash your hands throughout the day, but especially before and after touching the face, using the restroom, sneezing, coughing, or touching surfaces that have been coughed or sneezed upon. Hydration: Symptoms of most illnesses will be intensified and complicated by dehydration. Dehydration can also extend the duration of symptoms. Drink plenty of fluids and get plenty of rest. You should be drinking at least half a liter of water an hour to stay hydrated. Electrolyte drinks (ex. Gatorade, Powerade, Pedialyte) are also encouraged. You should be drinking enough fluids to make your urine light yellow, almost clear. If this is not the case, you are not drinking enough water. Please note that some of the treatments indicated below will not be effective if you are not adequately hydrated. Diet: Please concentrate on hydration, however, you may introduce food slowly.  Start with a clear liquid diet, progressed to a full liquid diet, and then bland solids as you are able. Pain or fever: Ibuprofen, Naproxen, or acetaminophen (generic for Tylenol) for pain or fever.  Antiinflammatory medications: Take 600 mg of ibuprofen every 6 hours or 440 mg (over the counter dose) to 500 mg (prescription dose) of naproxen every 12 hours for the next 3 days. After this time, these medications may be used as needed for pain. Take these medications with food to avoid upset stomach. Choose only one of these medications, do not take them together. Acetaminophen (generic for Tylenol): Should you continue to have additional pain while taking the ibuprofen or naproxen, you may add in acetaminophen as needed. Your daily total maximum amount of acetaminophen from all sources should be  limited to 4000mg /day for persons without liver problems, or 2000mg /day for those with liver problems. Cough: Use the benzonatate (generic for Tessalon) for cough.  Teas, warm liquids, broths, and honey can also help with cough. Prednisone: Take the prednisone, as directed, in its entirety. Zyrtec or Claritin: May add these medication daily to control underlying symptoms of congestion, sneezing, and other signs of allergies.  These medications are available over-the-counter. Generics: Cetirizine (generic for Zyrtec) and loratadine (generic for Claritin). Fluticasone: Use fluticasone (generic for Flonase), as directed, for nasal and sinus congestion.  This medication is available over-the-counter. Congestion: Plain guaifenesin (generic for plain Mucinex) may help relieve congestion. Saline sinus rinses and saline nasal sprays may also help relieve congestion. If you do not have high blood pressure, heart problems, or an allergy to such medications, you may also try phenylephrine or Sudafed. Sore throat: Warm liquids or Chloraseptic spray may help soothe a sore throat. Gargle twice a day with a salt water solution made from a half teaspoon of salt in a cup of warm water.  Follow up: Follow up with a primary care provider within the next two weeks should symptoms fail to resolve. Return: Return to the ED for significantly worsening symptoms, shortness of breath, persistent vomiting, large amounts of blood in stool, or any other major concerns.  For prescription assistance, may try using prescription discount sites or apps, such as goodrx.com

## 2018-02-26 NOTE — ED Provider Notes (Signed)
[facility] EMERGENCY DEPARTMENT Provider Note   CSN: [id number] Arrival date & time: 02/26/18  1244     History   Chief Complaint Chief Complaint  Patient presents with  . Cough    HPI Tyler Garrett is a 28 y.o. male.  HPI   Tyler Garrett is a 28 y.o. male, with a history of migraines, presenting to the ED with  Nonproductive cough beginning about 5 days ago.  Accompanied by nasal congestion, rhinorrhea, and intermittent subjective fever.  Many of the symptoms improved, but yesterday he developed a sore throat.  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  Denies shortness of breath, difficulty swallowing, facial pain, N/V/D, chest pain, abdominal pain, or any other complaints.   Past Medical History:  Diagnosis Date  . ADD (attention deficit disorder)   . Metacarpal bone fracture 07/2014   right ring  . Migraines     There are no active problems to display for this patient.   Past Surgical History:  Procedure Laterality Date  . NO PAST SURGERIES    . OPEN REDUCTION INTERNAL FIXATION (ORIF) METACARPAL Right 08/12/2014   Procedure: OPEN REDUCTION INTERNAL FIXATION (ORIF) RIGHT RING FINGER METACARPAL FRACTURE;  Surgeon: Betha LoaKevin Kuzma, MD;  Location: [facility];  Service: Orthopedics;  Laterality: Right;        Home Medications    Prior to Admission medications   Medication Sig Start Date End Date Taking? Authorizing Provider  benzonatate (TESSALON) 100 MG capsule Take 1 capsule (100 mg total) by mouth every 8 (eight) hours. 02/26/18   Joy, Shawn C, PA-C  fluticasone (FLONASE) 50 MCG/ACT nasal spray Place 2 sprays into both nostrils daily. 02/26/18   Joy, Ines BloomerShawn C, PA-C  HYDROcodone-acetaminophen (NORCO/VICODIN) 5-325 MG tablet Take 1-2 tablets every 6 hours as needed for severe pain 12/23/15   Renne CriglerGeiple, Joshua, PA-C  predniSONE (STERAPRED UNI-PAK 21 TAB) 10 MG (21) TBPK tablet Take 6 tabs (60mg ) day 1, 5 tabs (50mg ) day 2, 4 tabs (40mg ) day 3, 3  tabs (30mg ) day 4, 2 tabs (20mg ) day 5, and 1 tab (10mg ) day 6. 02/26/18   Joy, Hillard DankerShawn C, PA-C    Family History No family history on file.  Social History Social History   Tobacco Use  . Smoking status: Never Smoker  . Smokeless tobacco: Never Used  Substance Use Topics  . Alcohol use: No  . Drug use: No     Allergies   Patient has no known allergies.   Review of Systems Review of Systems  Constitutional: Positive for fever (intermittent).  HENT: Positive for congestion, rhinorrhea and sore throat. Negative for ear pain and trouble swallowing.   Respiratory: Positive for cough. Negative for shortness of breath.   Cardiovascular: Negative for chest pain.  Gastrointestinal: Negative for abdominal pain, diarrhea, nausea and vomiting.  Musculoskeletal: Negative for neck pain and neck stiffness.  Skin: Negative for rash.  Neurological: Negative for dizziness and headaches.  All other systems reviewed and are negative.    Physical Exam Updated Vital Signs BP 116/79   Pulse 82   Temp 98.4 F (36.9 C) (Oral)   Resp 18   Ht 6\' 3"  (1.905 m)   Wt 116.7 kg   SpO2 99%   BMI 32.15 kg/m   Physical Exam Vitals signs and nursing note reviewed.  Constitutional:      General: He is not in acute distress.    Appearance: He is well-developed. He is not diaphoretic.  HENT:  Head: Normocephalic and atraumatic.     Right Ear: Tympanic membrane, ear canal and external ear normal.     Left Ear: Tympanic membrane, ear canal and external ear normal.     Mouth/Throat:     Mouth: Mucous membranes are moist.     Pharynx: Oropharynx is clear. Posterior oropharyngeal erythema present. No oropharyngeal exudate.  Eyes:     Conjunctiva/sclera: Conjunctivae normal.  Neck:     Musculoskeletal: Neck supple.  Cardiovascular:     Rate and Rhythm: Normal rate and regular rhythm.     Pulses: Normal pulses.     Heart sounds: Normal heart sounds.  Pulmonary:     Effort: Pulmonary effort is  normal. No respiratory distress.     Breath sounds: Normal breath sounds.  Abdominal:     Palpations: Abdomen is soft.     Tenderness: There is no abdominal tenderness. There is no guarding.  Musculoskeletal:     Right lower leg: No edema.     Left lower leg: No edema.  Lymphadenopathy:     Cervical: No cervical adenopathy.  Skin:    General: Skin is warm and dry.  Neurological:     Mental Status: He is alert.  Psychiatric:        Mood and Affect: Mood and affect normal.        Speech: Speech normal.        Behavior: Behavior normal.      ED Treatments / Results  Labs (all labs ordered are listed, but only abnormal results are displayed) Labs Reviewed  GROUP A STREP BY PCR    EKG None  Radiology No results found.  Procedures Procedures (including critical care time)  Medications Ordered in ED Medications - No data to display   Initial Impression / Assessment and Plan / ED Course  I have reviewed the triage vital signs and the nursing notes.  Pertinent labs & imaging results that were available during my care of the patient were reviewed by me and considered in my medical decision making (see chart for details).     Patient presents with symptoms consistent with viral syndrome.  Patient is nontoxic appearing, afebrile, not tachycardic, not tachypneic, not hypotensive, excellent SPO2 on room air, and is in no apparent distress.  Strep test negative. The patient was given instructions for home care as well as return precautions. Patient voices understanding of these instructions, accepts the plan, and is comfortable with discharge.    Final Clinical Impressions(s) / ED Diagnoses   Final diagnoses:  Cough  Sore throat    ED Discharge Orders         Ordered    fluticasone (FLONASE) 50 MCG/ACT nasal spray  Daily     02/26/18 1410    predniSONE (STERAPRED UNI-PAK 21 TAB) 10 MG (21) TBPK tablet     02/26/18 1410    benzonatate (TESSALON) 100 MG capsule   Every 8 hours     02/26/18 1410           Anselm Pancoast, PA-C 02/26/18 1412    Benjiman Core, MD 02/26/18 [phone]

## 2020-05-29 ENCOUNTER — Emergency Department (HOSPITAL_BASED_OUTPATIENT_CLINIC_OR_DEPARTMENT_OTHER)
Admission: EM | Admit: 2020-05-29 | Discharge: 2020-05-29 | Disposition: A | Discharge status: Home / Self Care | Payer: 59 | Attending: Emergency Medicine | Admitting: Emergency Medicine

## 2020-05-29 ENCOUNTER — Encounter (HOSPITAL_BASED_OUTPATIENT_CLINIC_OR_DEPARTMENT_OTHER): Payer: Self-pay | Admitting: *Deleted

## 2020-05-29 ENCOUNTER — Other Ambulatory Visit: Payer: Self-pay

## 2020-05-29 ENCOUNTER — Emergency Department (HOSPITAL_BASED_OUTPATIENT_CLINIC_OR_DEPARTMENT_OTHER): Payer: 59

## 2020-05-29 DIAGNOSIS — R0602 Shortness of breath: Secondary | ICD-10-CM | POA: Insufficient documentation

## 2020-05-29 DIAGNOSIS — R079 Chest pain, unspecified: Secondary | ICD-10-CM | POA: Diagnosis not present

## 2020-05-29 LAB — CBC
HCT: 47.6 % (ref 39.0–52.0)
Hemoglobin: 15.5 g/dL (ref 13.0–17.0)
MCH: 27.5 pg (ref 26.0–34.0)
MCHC: 32.6 g/dL (ref 30.0–36.0)
MCV: 84.5 fL (ref 80.0–100.0)
Platelets: 300 10*3/uL (ref 150–400)
RBC: 5.63 MIL/uL (ref 4.22–5.81)
RDW: 12.8 % (ref 11.5–15.5)
WBC: 7.6 10*3/uL (ref 4.0–10.5)
nRBC: 0 % (ref 0.0–0.2)

## 2020-05-29 LAB — BASIC METABOLIC PANEL
Anion gap: 9 (ref 5–15)
BUN: 14 mg/dL (ref 6–20)
CO2: 24 mmol/L (ref 22–32)
Calcium: 8.9 mg/dL (ref 8.9–10.3)
Chloride: 104 mmol/L (ref 98–111)
Creatinine, Ser: 1.34 mg/dL — ABNORMAL HIGH (ref 0.61–1.24)
GFR, Estimated: >60 mL/min (ref >60)
Glucose, Bld: 120 mg/dL — ABNORMAL HIGH (ref 70–99)
Potassium: 3.9 mmol/L (ref 3.5–5.1)
Sodium: 137 mmol/L (ref 135–145)

## 2020-05-29 LAB — TROPONIN I (HIGH SENSITIVITY): Troponin I (High Sensitivity): 3 ng/L (ref <18)

## 2020-05-29 NOTE — ED Provider Notes (Signed)
[facility] EMERGENCY DEPARTMENT Provider Note   CSN: [id number] Arrival date & time: 05/29/20  1711     History Chief Complaint  Patient presents with  . Shortness of Breath    Tyler Garrett is a 30 y.o. male.  Patient presents to the emergency department today for evaluation of intermittent episodes of shortness of breath and chest tightness.  These occur intermittently.  Sometimes they are worse with exertion.  He denies radiation of pain, associated diaphoresis or vomiting.  Last episode of chest tightness was yesterday.  He states that when he gets these he feels like he cannot get a deep breath.  No fevers or cough.  Denies URI symptoms.  He does report approximately 70 pound weight gain over the past 2 years.  He states that his wife notes pauses in breathing during sleep.  Patient denies diagnosis of hypertension, high cholesterol, diabetes.  He does not smoke or use tobacco.  He does not have a family history of coronary artery disease, states that his father had a "irregular heartbeat".  Patient denies risk factors for pulmonary embolism including: unilateral leg swelling, history of DVT/PE/other blood clots, use of exogenous hormones, recent immobilizations, recent surgery, recent travel (>4hr segment), malignancy, hemoptysis.          Past Medical History:  Diagnosis Date  . ADD (attention deficit disorder)   . Metacarpal bone fracture 07/2014   right ring  . Migraines     There are no problems to display for this patient.   Past Surgical History:  Procedure Laterality Date  . NO PAST SURGERIES    . OPEN REDUCTION INTERNAL FIXATION (ORIF) METACARPAL Right 08/12/2014   Procedure: OPEN REDUCTION INTERNAL FIXATION (ORIF) RIGHT RING FINGER METACARPAL FRACTURE;  Surgeon: Betha Loa, MD;  Location: [facility];  Service: Orthopedics;  Laterality: Right;       No family history on file.  Social History   Tobacco Use  . Smoking status:  Never Smoker  . Smokeless tobacco: Never Used  Substance Use Topics  . Alcohol use: No  . Drug use: No    Home Medications Prior to Admission medications   Medication Sig Start Date End Date Taking? Authorizing Provider  benzonatate (TESSALON) 100 MG capsule Take 1 capsule (100 mg total) by mouth every 8 (eight) hours. 02/26/18   Joy, Shawn C, PA-C  fluticasone (FLONASE) 50 MCG/ACT nasal spray Place 2 sprays into both nostrils daily. 02/26/18   Joy, Ines Bloomer C, PA-C  HYDROcodone-acetaminophen (NORCO/VICODIN) 5-325 MG tablet Take 1-2 tablets every 6 hours as needed for severe pain 12/23/15   Renne Crigler, PA-C  predniSONE (STERAPRED UNI-PAK 21 TAB) 10 MG (21) TBPK tablet Take 6 tabs (60mg ) day 1, 5 tabs (50mg ) day 2, 4 tabs (40mg ) day 3, 3 tabs (30mg ) day 4, 2 tabs (20mg ) day 5, and 1 tab (10mg ) day 6. 02/26/18   Joy, Shawn C, PA-C    Allergies    Patient has no known allergies.  Review of Systems   Review of Systems  Constitutional: Negative for diaphoresis and fever.  Eyes: Negative for redness.  Respiratory: Positive for chest tightness and shortness of breath. Negative for cough.   Cardiovascular: Positive for chest pain. Negative for palpitations and leg swelling.  Gastrointestinal: Negative for abdominal pain, nausea and vomiting.  Genitourinary: Negative for dysuria.  Musculoskeletal: Negative for back pain and neck pain.  Skin: Negative for rash.  Neurological: Negative for syncope and light-headedness.  Psychiatric/Behavioral: The patient is  not nervous/anxious.     Physical Exam Updated Vital Signs BP 128/89 (BP Location: Right Arm)   Pulse 100   Temp 98.5 F (36.9 C) (Oral)   Resp 20   Ht 6\' 3"  (1.905 m)   Wt 125.1 kg   SpO2 95%   BMI 34.47 kg/m   Physical Exam Vitals and nursing note reviewed.  Constitutional:      Appearance: He is well-developed. He is not diaphoretic.  HENT:     Head: Normocephalic and atraumatic.     Mouth/Throat:     Mouth: Mucous  membranes are not dry.  Eyes:     Conjunctiva/sclera: Conjunctivae normal.  Neck:     Vascular: Normal carotid pulses. No carotid bruit or JVD.     Trachea: Trachea normal. No tracheal deviation.  Cardiovascular:     Rate and Rhythm: Normal rate and regular rhythm.     Pulses: No decreased pulses.     Heart sounds: Normal heart sounds, S1 normal and S2 normal. Heart sounds not distant. No murmur heard.   Pulmonary:     Effort: Pulmonary effort is normal. No respiratory distress.     Breath sounds: Normal breath sounds. No wheezing.  Chest:     Chest wall: No tenderness.  Abdominal:     General: Bowel sounds are normal.     Palpations: Abdomen is soft.     Tenderness: There is no abdominal tenderness. There is no guarding or rebound.  Musculoskeletal:     Cervical back: Normal range of motion and neck supple. No muscular tenderness.     Right lower leg: No tenderness. No edema.     Left lower leg: No tenderness. No edema.     Comments: No tenderness or other clinical signs of DVT.  Skin:    General: Skin is warm and dry.     Coloration: Skin is not pale.  Neurological:     Mental Status: He is alert.     ED Results / Procedures / Treatments   Labs (all labs ordered are listed, but only abnormal results are displayed) Labs Reviewed  BASIC METABOLIC PANEL - Abnormal; Notable for the following components:      Result Value   Glucose, Bld 120 (*)    Creatinine, Ser 1.34 (*)    All other components within normal limits  CBC  TROPONIN I (HIGH SENSITIVITY)    ED ECG REPORT   Date: 05/29/2020  Rate: 87  Rhythm: normal sinus rhythm  QRS Axis: normal  Intervals: normal  ST/T Wave abnormalities: early repolarization  Conduction Disutrbances:none  Narrative Interpretation:   Old EKG Reviewed: none available  I have personally reviewed the EKG tracing and agree with the computerized printout as noted.  Radiology DG Chest 2 View  Result Date: 05/29/2020 CLINICAL DATA:   Shortness of breath chest tightness EXAM: CHEST - 2 VIEW COMPARISON:  None. FINDINGS: The heart size and mediastinal contours are within normal limits. Both lungs are clear. The visualized skeletal structures are unremarkable. IMPRESSION: No active cardiopulmonary disease. Electronically Signed   By: 05/31/2020 M.D.   On: 05/29/2020 18:56    Procedures Procedures   Medications Ordered in ED Medications - No data to display  ED Course  I have reviewed the triage vital signs and the nursing notes.  Pertinent labs & imaging results that were available during my care of the patient were reviewed by me and considered in my medical decision making (see chart for details).  Patient  seen and examined. Work-up initiated.  Will check labs, chest x-ray.  Reviewed EKG, consistent with signs of early repolarization.  Patient appears comfortable.  Will likely benefit from PCP follow-up.  Vital signs reviewed and are as follows: BP 128/89 (BP Location: Right Arm)   Pulse 100   Temp 98.5 F (36.9 C) (Oral)   Resp 20   Ht 6\' 3"  (1.905 m)   Wt 125.1 kg   SpO2 95%   BMI 34.47 kg/m   HEART score=3 assuming neg trop. PERC neg and doubt PE based on symptoms and risk factors.   7:33 PM Work-up is reassuring, patient updated.   Strongly encourage PCP follow-up for further evaluation, possible evaluation for sleep apnea, and risk factor modification.  Encouraged return to the emergency department for evaluation of worsening chest pain, shortness of breath, new symptoms or other concerns.    MDM Rules/Calculators/A&P                          Pt here with intermittent chest tightness and shortness of breath.  PERC negative.  Vital signs reassuring.  Cardiac evaluation with nonischemic EKG, troponin negative x1, chest x-ray clear.  Do not suspect ACS at this time.  Doubt PE, dissection.  No signs of pneumonia or other infection.  He does have some symptoms concerning for sleep apnea.  Encouraged PCP  follow-up.    Final Clinical Impression(s) / ED Diagnoses Final diagnoses:  Shortness of breath    Rx / DC Orders ED Discharge Orders    None       05/29/20 05/31/20, MD 05/29/20 2033

## 2020-05-29 NOTE — ED Triage Notes (Signed)
SOB x 2 weeks. Chest pain described as tightness in the center of his chest. He feels like he is gasping for air.

## 2020-05-29 NOTE — Discharge Instructions (Signed)
Please read and follow all provided instructions.  Your diagnoses today include:  1. Shortness of breath     Tests performed today include:  An EKG of your heart  A chest x-ray - is clear, no sign of pneumonia or other problems  Cardiac enzymes - a blood test for heart muscle damage was normal  Blood counts and electrolytes  Vital signs. See below for your results today.   Medications prescribed:   None  Take any prescribed medications only as directed.  Follow-up instructions: Please follow-up with your primary care provider as soon as you can for further evaluation of your symptoms.   Return instructions:  SEEK IMMEDIATE MEDICAL ATTENTION IF:  You have severe chest pain, especially if the pain is crushing or pressure-like and spreads to the arms, back, neck, or jaw, or if you have sweating, nausea (feeling sick to your stomach), or shortness of breath. THIS IS AN EMERGENCY. Don't wait to see if the pain will go away. Get medical help at once. Call 911 or [phone] (operator). DO NOT drive yourself to the hospital.   Your chest pain gets worse and does not go away with rest.   You have an attack of chest pain lasting longer than usual, despite rest and treatment with the medications your caregiver has prescribed.   You wake from sleep with chest pain or shortness of breath.  You feel dizzy or faint.  You have chest pain not typical of your usual pain for which you originally saw your caregiver.   You have any other emergent concerns regarding your health.  Additional Information: Chest pain comes from many different causes. Your caregiver has diagnosed you as having chest pain that is not specific for one problem, but does not require admission.  You are at low risk for an acute heart condition or other serious illness.   Your vital signs today were: BP 117/72   Pulse 83   Temp 98.5 F (36.9 C) (Oral)   Resp 20   Ht 6\' 3"  (1.905 m)   Wt 125.1 kg   SpO2 96%   BMI 34.47  kg/m  If your blood pressure (BP) was elevated above 135/85 this visit, please have this repeated by your doctor within one month. --------------

## 2020-05-29 NOTE — ED Notes (Signed)
Patient transported to CT 

## 2021-10-27 IMAGING — CR DG CHEST 2V
2 series · 2 of 2 positions shown · non-contrast
Comparison: None.

CLINICAL DATA: Shortness of breath chest tightness

EXAM:
CHEST - 2 VIEW

[w chest pa]
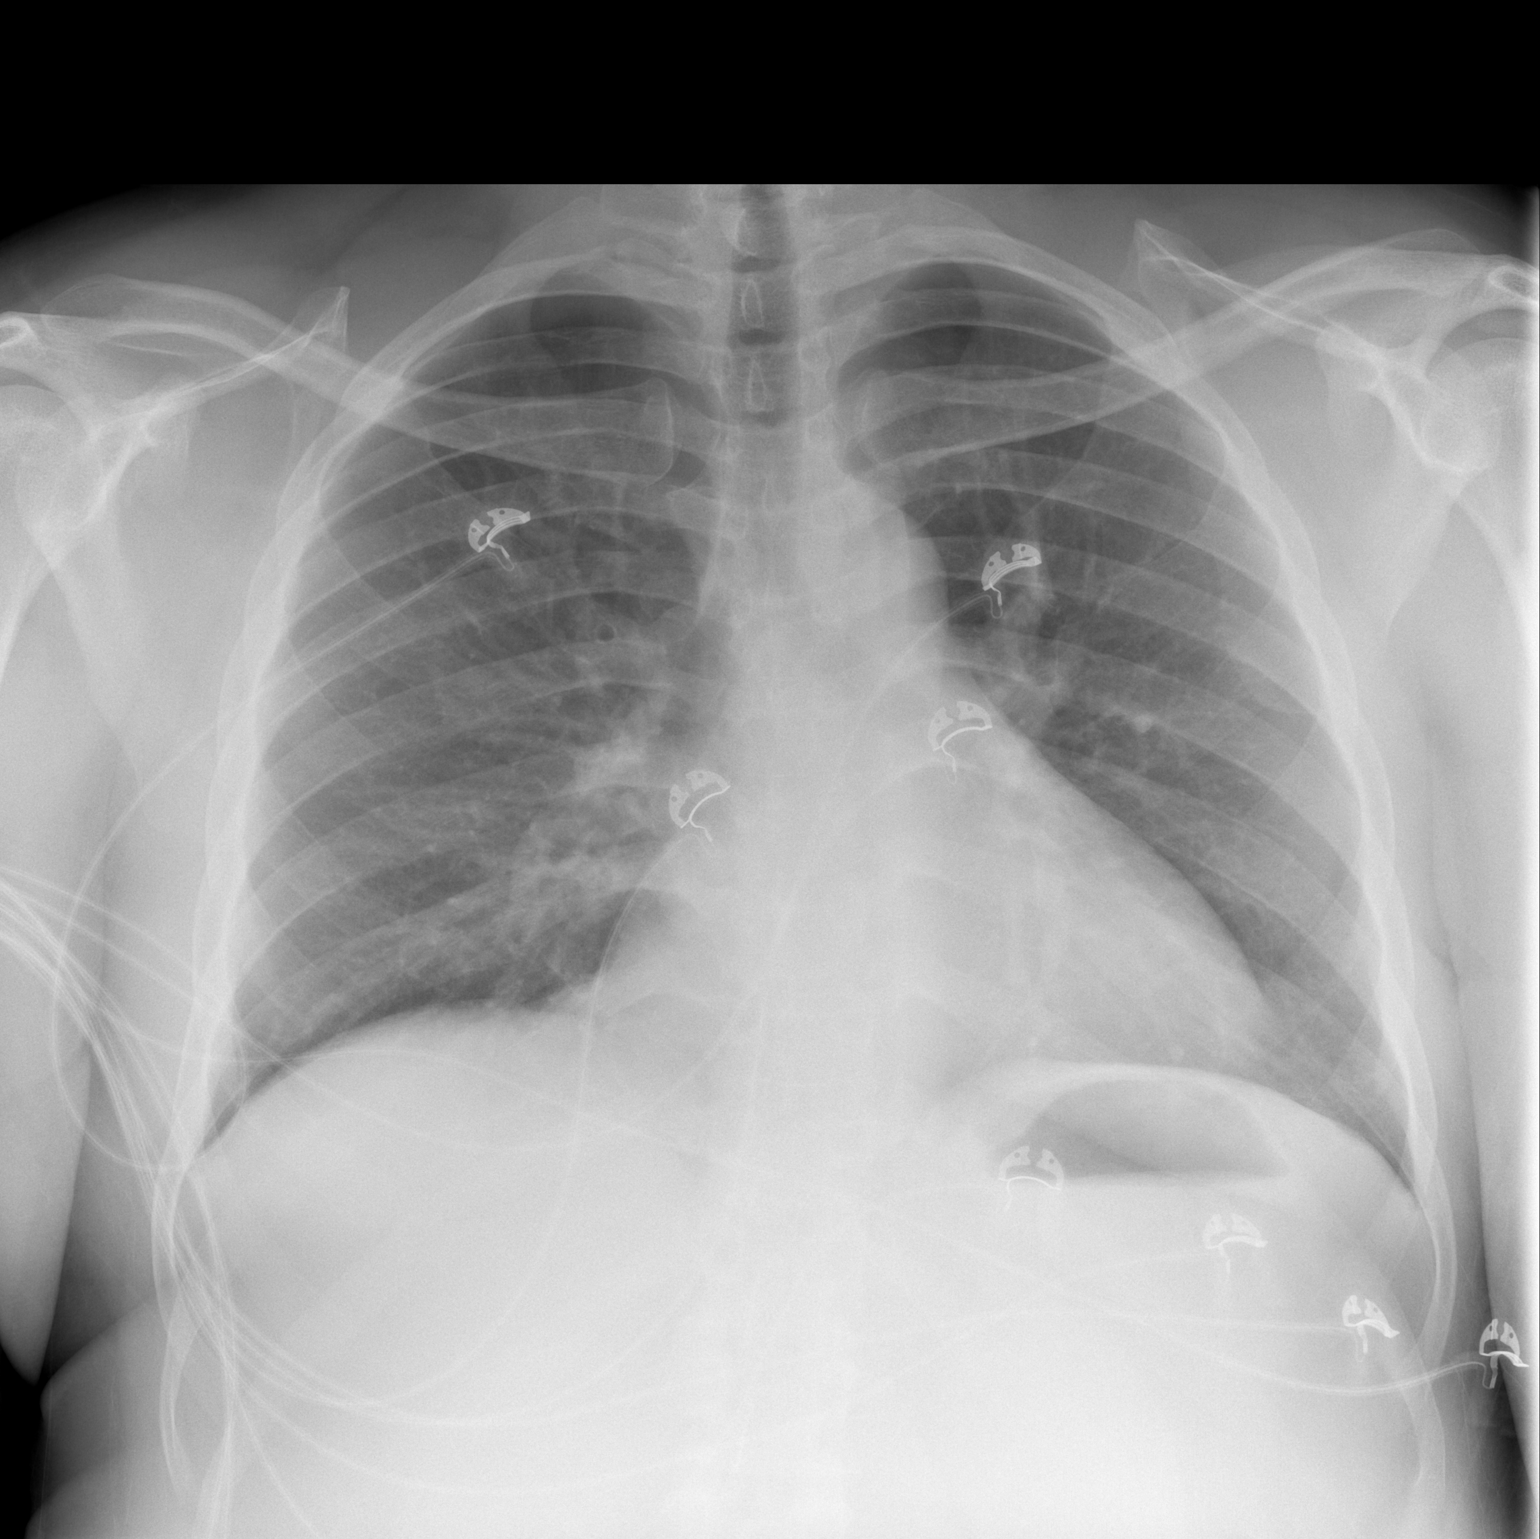

[w chest lat]
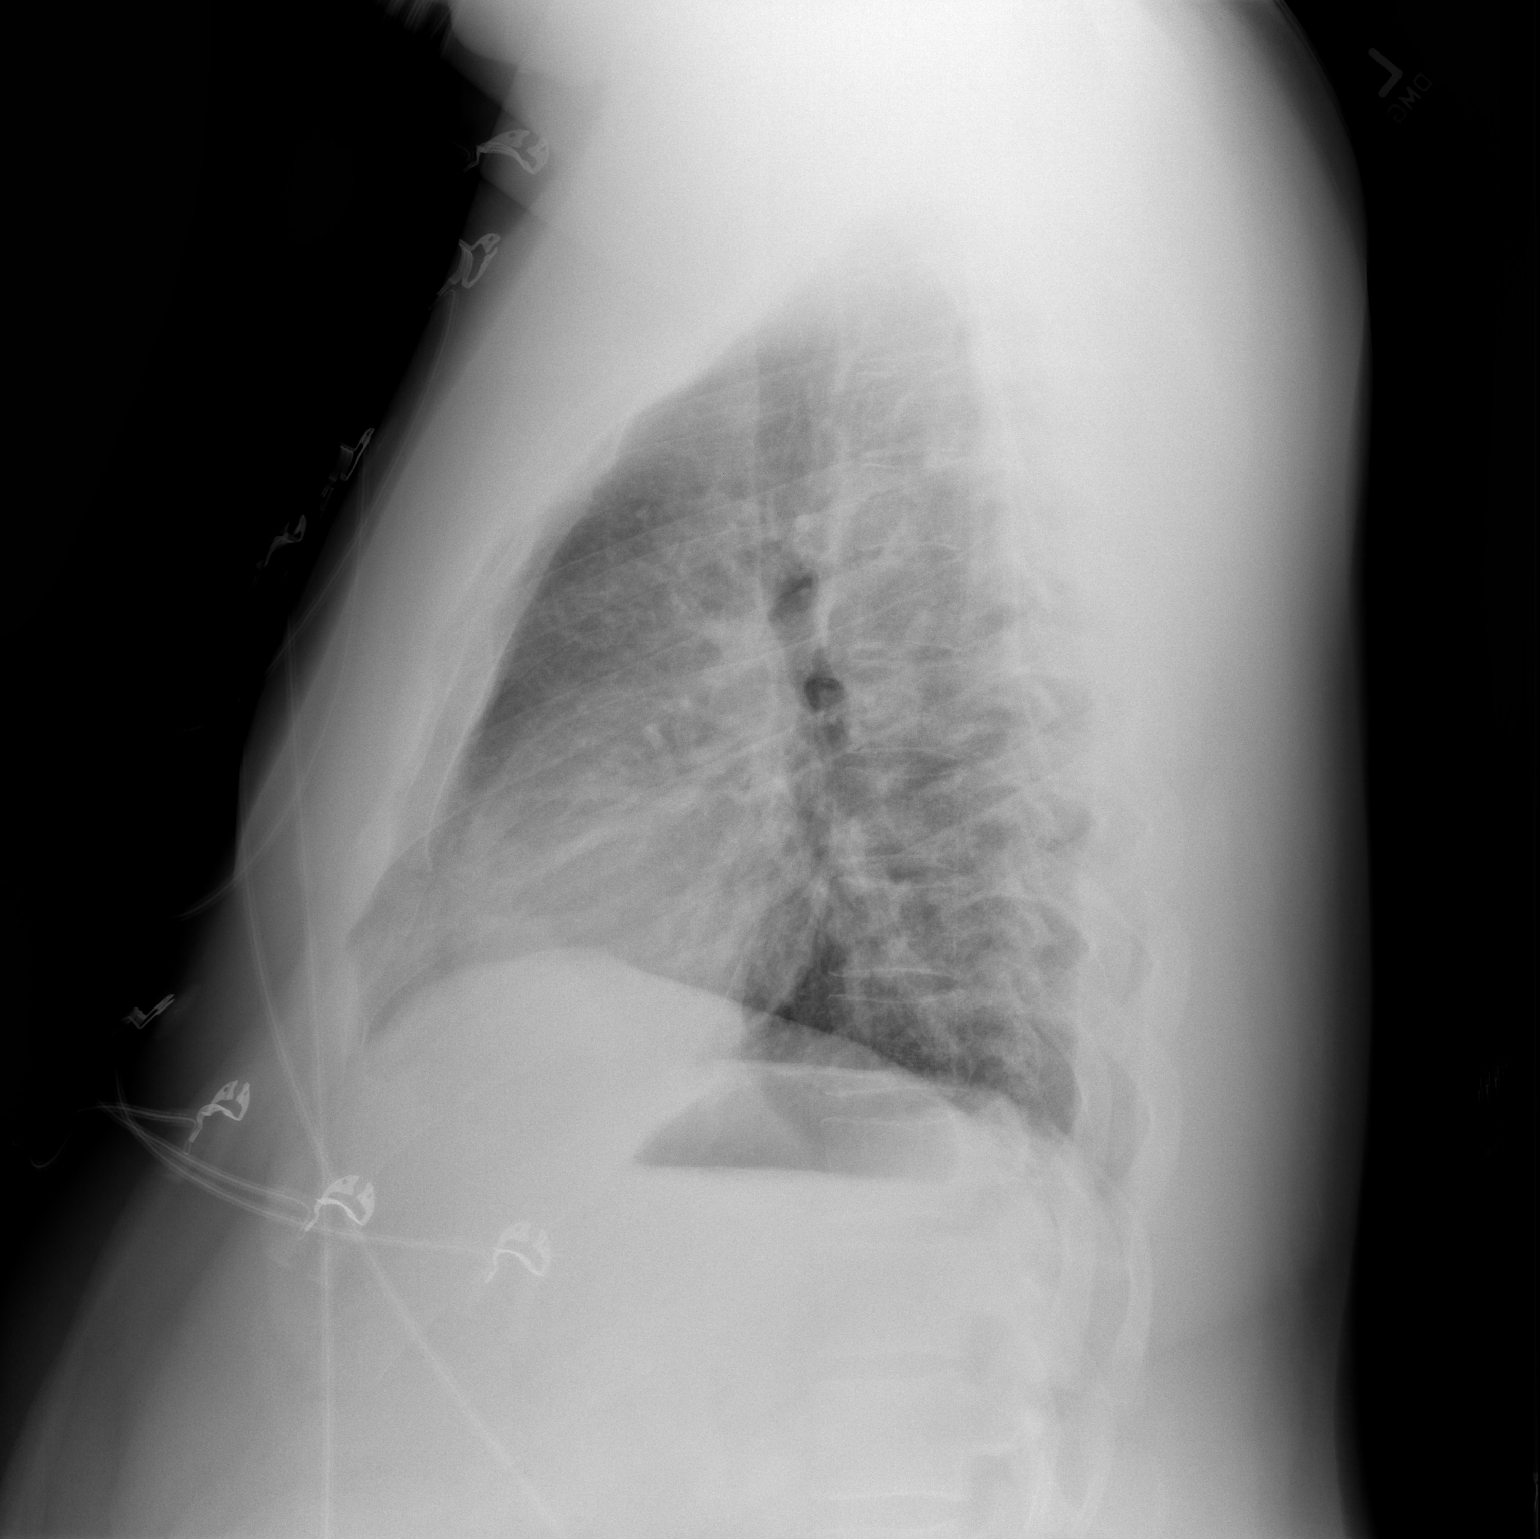

[2 of 2 positions shown; findings below may reference images not displayed]

FINDINGS: The heart size and mediastinal contours are within normal limits.
Both lungs are clear. The visualized skeletal structures are
unremarkable.
IMPRESSION: No active cardiopulmonary disease.
